# Patient Record
Sex: Female | Born: 1995 | Race: White | Hispanic: No | Marital: Single | Smoking: Never smoker
Health system: Southern US, Community
[De-identification: ages and names within clinical notes are randomized; demographics above are authoritative.]

## PROBLEM LIST (undated history)

## (undated) DIAGNOSIS — F419 Anxiety disorder, unspecified: Secondary | ICD-10-CM

## (undated) DIAGNOSIS — A6920 Lyme disease, unspecified: Secondary | ICD-10-CM

## (undated) DIAGNOSIS — O1490 Unspecified pre-eclampsia, unspecified trimester: Secondary | ICD-10-CM

## (undated) DIAGNOSIS — S060XAA Concussion with loss of consciousness status unknown, initial encounter: Secondary | ICD-10-CM

## (undated) DIAGNOSIS — S060X9A Concussion with loss of consciousness of unspecified duration, initial encounter: Secondary | ICD-10-CM

## (undated) HISTORY — PX: NO PAST SURGERIES: SHX2092

## (undated) HISTORY — DX: Anxiety disorder, unspecified: F41.9

## (undated) HISTORY — DX: Unspecified pre-eclampsia, unspecified trimester: O14.90

---

## 2018-06-27 ENCOUNTER — Ambulatory Visit (HOSPITAL_COMMUNITY)
Admission: EM | Admit: 2018-06-27 | Discharge: 2018-06-27 | Disposition: A | Discharge status: Home / Self Care | Payer: Worker's Compensation | Attending: Family Medicine | Admitting: Family Medicine

## 2018-06-27 ENCOUNTER — Encounter (HOSPITAL_COMMUNITY): Payer: Self-pay | Admitting: Emergency Medicine

## 2018-06-27 ENCOUNTER — Ambulatory Visit (INDEPENDENT_AMBULATORY_CARE_PROVIDER_SITE_OTHER): Payer: Worker's Compensation

## 2018-06-27 DIAGNOSIS — S60222A Contusion of left hand, initial encounter: Secondary | ICD-10-CM

## 2018-06-27 MED ORDER — KETOROLAC TROMETHAMINE 30 MG/ML IJ SOLN
30.0000 mg | Freq: Once | INTRAMUSCULAR | Status: AC
  Administered 2018-06-27: 30 mg via INTRAMUSCULAR

## 2018-06-27 MED ORDER — KETOROLAC TROMETHAMINE 30 MG/ML IJ SOLN
INTRAMUSCULAR | Status: AC
  Filled 2018-06-27: qty 1
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
## 2018-06-27 NOTE — Discharge Instructions (Signed)
Use ice and elevation to reduce pain and swelling Ibuprofen, naproxen or acetaminophen for pain Limit use of hand Follow up with workers compensation clinic if you fail to improve
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
## 2018-12-25 ENCOUNTER — Other Ambulatory Visit: Payer: Self-pay

## 2018-12-25 ENCOUNTER — Emergency Department (HOSPITAL_COMMUNITY): Payer: BC Managed Care – PPO

## 2018-12-25 ENCOUNTER — Emergency Department (HOSPITAL_COMMUNITY)
Admission: EM | Admit: 2018-12-25 | Discharge: 2018-12-25 | Disposition: A | Discharge status: Home / Self Care | Payer: BC Managed Care – PPO | Attending: Emergency Medicine | Admitting: Emergency Medicine

## 2018-12-25 ENCOUNTER — Encounter (HOSPITAL_COMMUNITY): Payer: Self-pay | Admitting: Emergency Medicine

## 2018-12-25 DIAGNOSIS — A6920 Lyme disease, unspecified: Secondary | ICD-10-CM | POA: Diagnosis not present

## 2018-12-25 DIAGNOSIS — R531 Weakness: Secondary | ICD-10-CM | POA: Diagnosis present

## 2018-12-25 DIAGNOSIS — G43109 Migraine with aura, not intractable, without status migrainosus: Secondary | ICD-10-CM | POA: Insufficient documentation

## 2018-12-25 HISTORY — DX: Concussion with loss of consciousness status unknown, initial encounter: S06.0XAA

## 2018-12-25 HISTORY — DX: Lyme disease, unspecified: A69.20

## 2018-12-25 HISTORY — DX: Concussion with loss of consciousness of unspecified duration, initial encounter: S06.0X9A

## 2018-12-25 LAB — CBC WITH DIFFERENTIAL/PLATELET
Abs Immature Granulocytes: 0.02 10*3/uL (ref 0.00–0.07)
Basophils Absolute: 0.1 10*3/uL (ref 0.0–0.1)
Basophils Relative: 1 %
Eosinophils Absolute: 0.1 10*3/uL (ref 0.0–0.5)
Eosinophils Relative: 2 %
HCT: 41.1 % (ref 36.0–46.0)
Hemoglobin: 13.7 g/dL (ref 12.0–15.0)
Immature Granulocytes: 0 %
Lymphocytes Relative: 29 %
Lymphs Abs: 2.2 10*3/uL (ref 0.7–4.0)
MCH: 28.7 pg (ref 26.0–34.0)
MCHC: 33.3 g/dL (ref 30.0–36.0)
MCV: 86.2 fL (ref 80.0–100.0)
Monocytes Absolute: 0.7 10*3/uL (ref 0.1–1.0)
Monocytes Relative: 10 %
Neutro Abs: 4.5 10*3/uL (ref 1.7–7.7)
Neutrophils Relative %: 58 %
Platelets: 242 10*3/uL (ref 150–400)
RBC: 4.77 MIL/uL (ref 3.87–5.11)
RDW: 12.5 % (ref 11.5–15.5)
WBC: 7.6 10*3/uL (ref 4.0–10.5)
nRBC: 0 % (ref 0.0–0.2)

## 2018-12-25 LAB — COMPREHENSIVE METABOLIC PANEL
ALT: 16 U/L (ref 0–44)
AST: 17 U/L (ref 15–41)
Albumin: 4.5 g/dL (ref 3.5–5.0)
Alkaline Phosphatase: 61 U/L (ref 38–126)
Anion gap: 11 (ref 5–15)
BUN: 14 mg/dL (ref 6–20)
CO2: 22 mmol/L (ref 22–32)
Calcium: 9.4 mg/dL (ref 8.9–10.3)
Chloride: 105 mmol/L (ref 98–111)
Creatinine, Ser: 0.75 mg/dL (ref 0.44–1.00)
GFR calc Af Amer: >60 mL/min (ref >60)
GFR calc non Af Amer: >60 mL/min (ref >60)
Glucose, Bld: 95 mg/dL (ref 70–99)
Potassium: 4.2 mmol/L (ref 3.5–5.1)
Sodium: 138 mmol/L (ref 135–145)
Total Bilirubin: 0.9 mg/dL (ref 0.3–1.2)
Total Protein: 7.3 g/dL (ref 6.5–8.1)

## 2018-12-25 LAB — ETHANOL: Alcohol, Ethyl (B): <10 mg/dL (ref <10)

## 2018-12-25 LAB — I-STAT BETA HCG BLOOD, ED (MC, WL, AP ONLY): I-stat hCG, quantitative: <5 m[IU]/mL (ref <5)

## 2018-12-25 LAB — PROTIME-INR
INR: 1.1 (ref 0.8–1.2)
Prothrombin Time: 13.6 seconds (ref 11.4–15.2)

## 2018-12-25 LAB — APTT: aPTT: 30 seconds (ref 24–36)

## 2018-12-25 LAB — I-STAT CREATININE, ED: Creatinine, Ser: 0.7 mg/dL (ref 0.44–1.00)

## 2018-12-25 MED ORDER — IOHEXOL 350 MG/ML SOLN
75.0000 mL | Freq: Once | INTRAVENOUS | Status: AC | PRN
  Administered 2018-12-25: 75 mL via INTRAVENOUS

## 2018-12-25 MED ORDER — METOCLOPRAMIDE HCL 5 MG/ML IJ SOLN
10.0000 mg | Freq: Once | INTRAMUSCULAR | Status: AC
  Administered 2018-12-25: 10 mg via INTRAVENOUS
  Filled 2018-12-25: qty 2

## 2018-12-25 MED ORDER — KETOROLAC TROMETHAMINE 30 MG/ML IJ SOLN
30.0000 mg | Freq: Once | INTRAMUSCULAR | Status: AC
  Administered 2018-12-25: 30 mg via INTRAVENOUS
  Filled 2018-12-25: qty 1

## 2018-12-25 MED ORDER — DIPHENHYDRAMINE HCL 50 MG/ML IJ SOLN
25.0000 mg | Freq: Once | INTRAMUSCULAR | Status: AC
  Administered 2018-12-25: 25 mg via INTRAVENOUS
  Filled 2018-12-25: qty 1

## 2018-12-25 NOTE — ED Notes (Signed)
Patient verbalizes understanding of discharge instructions. Opportunity for questioning and answers were provided. 

## 2018-12-25 NOTE — ED Notes (Signed)
MD and PA made aware of pt's neuro symptoms

## 2018-12-25 NOTE — ED Notes (Signed)
ED Provider at bedside. 

## 2018-12-25 NOTE — Discharge Instructions (Addendum)
Your symptoms may be due to complicated migraine.  Call and follow up with neurology for further management of your condition.  Return if you have any concerns.  Fortunately no evidence of stroke causing your condition.
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
## 2019-01-13 ENCOUNTER — Other Ambulatory Visit: Payer: Self-pay

## 2019-01-13 ENCOUNTER — Ambulatory Visit (INDEPENDENT_AMBULATORY_CARE_PROVIDER_SITE_OTHER): Payer: BC Managed Care – PPO

## 2019-01-13 ENCOUNTER — Ambulatory Visit (HOSPITAL_COMMUNITY)
Admission: EM | Admit: 2019-01-13 | Discharge: 2019-01-13 | Disposition: A | Discharge status: Home / Self Care | Payer: BC Managed Care – PPO | Attending: Family Medicine | Admitting: Family Medicine

## 2019-01-13 ENCOUNTER — Encounter (HOSPITAL_COMMUNITY): Payer: Self-pay | Admitting: *Deleted

## 2019-01-13 DIAGNOSIS — S91352A Open bite, left foot, initial encounter: Secondary | ICD-10-CM

## 2019-01-13 DIAGNOSIS — W540XXA Bitten by dog, initial encounter: Secondary | ICD-10-CM | POA: Diagnosis not present

## 2019-01-13 MED ORDER — CLINDAMYCIN HCL 300 MG PO CAPS: 300.0000 mg | ORAL_CAPSULE | Freq: Three times a day (TID) | ORAL | 0 refills | Status: AC

## 2019-01-13 NOTE — Discharge Instructions (Signed)
X-rays did not show fracture or dislocation; no obvious foreign bodies Up-to-date on tetanus Wash site with warm water and mild soap once to twice daily Change dressing daily Clindamycin 300 mg three times daily.  Take as directed and to completion Return or go to the ED if you have any new or worsening symptoms such as increasing redness, swelling, drainage, fever, chills, nausea, chest pain, SOB, etc..Marland Kitchen

## 2019-01-13 NOTE — ED Provider Notes (Signed)
[facility]   [id number] 01/13/19 Arrival Time: 1101  CC: Dog bite  SUBJECTIVE:  Renee Hobbs is a 23 y.o. female hx significant for lyme disease, who presents with a dog bite that occurred 1 day ago.  Localizes the bite to left foot.  Describes it as painful and red.  Has tried cleaning with H2O2 and applying bactine and neosporin.  States dog is up to date on rabies vaccine.  Denies fever, chills, nausea, vomiting, swelling, discharge, SOB, chest pain, abdominal pain, changes in bowel or bladder function.    ROS: As per HPI.  Past Medical History:  Diagnosis Date  . Concussion   . Lyme disease    History reviewed. No pertinent surgical history. Allergies  Allergen Reactions  . Amoxicillin Shortness Of Breath  . Doxycycline Shortness Of Breath   No current facility-administered medications on file prior to encounter.    No current outpatient medications on file prior to encounter.   Social History   Socioeconomic History  . Marital status: Single    Spouse name: Not on file  . Number of children: Not on file  . Years of education: Not on file  . Highest education level: Not on file  Occupational History  . Not on file  Social Needs  . Financial resource strain: Not on file  . Food insecurity:    Worry: Not on file    Inability: Not on file  . Transportation needs:    Medical: Not on file    Non-medical: Not on file  Tobacco Use  . Smoking status: Never Smoker  . Smokeless tobacco: Never Used  Substance and Sexual Activity  . Alcohol use: Not Currently  . Drug use: Yes    Types: Marijuana  . Sexual activity: Not on file  Lifestyle  . Physical activity:    Days per week: Not on file    Minutes per session: Not on file  . Stress: Not on file  Relationships  . Social connections:    Talks on phone: Not on file    Gets together: Not on file    Attends religious service: Not on file    Active member of club or organization: Not on file    Attends  meetings of clubs or organizations: Not on file    Relationship status: Not on file  . Intimate partner violence:    Fear of current or ex partner: Not on file    Emotionally abused: Not on file    Physically abused: Not on file    Forced sexual activity: Not on file  Other Topics Concern  . Not on file  Social History Narrative  . Not on file   Family History  Problem Relation Age of Onset  . Heart failure Mother     OBJECTIVE: Vitals:   01/13/19 1111  BP: 114/69  Pulse: 65  Resp: 16  Temp: (!) 97.2 F (36.2 C)  TempSrc: Oral  SpO2: 96%    General appearance: alert; no distress Head: NCAT Lungs: clear to auscultation bilaterally CV: Dorsalis pedis pulse 2+; cap refill <2 sec Extremities: no edema; strength and sensation intact about the left lower extremity Skin: warm and dry; two puncture wounds < 0.5 cm localized to the distal dorsum of the left foot over the 4th MT with overlying erythema, two small scratches/ abrasions localized to medial distal first MT with overlying erythema; no obvious drainage or bleeding (see pictures below) Psychological: alert and cooperative; normal mood and affect  DIAGNOSTIC STUDIES:  Dg Foot Complete Left  Result Date: 01/13/2019 CLINICAL DATA:  Left foot pain after suffering a dog bite yesterday. EXAM: LEFT FOOT - COMPLETE 3+ VIEW COMPARISON:  None. FINDINGS: Soft tissue swelling is suspected laceration about the dorsal aspect of the forefoot. No associated fracture or radiopaque foreign body. Joint spaces are preserved. No significant hallux valgus deformity. No erosions. No plantar calcaneal spur. IMPRESSION: Soft tissue swelling suspected laceration about the forefoot without associated fracture or radiopaque foreign body. Electronically Signed   By: Simonne ComeJohn  Watts M.D.   On: 01/13/2019 11:53    ASSESSMENT & PLAN:  1. Dog bite of left foot, initial encounter     Meds ordered this encounter  Medications  . clindamycin  (CLEOCIN) 300 MG capsule    Sig: Take 1 capsule (300 mg total) by mouth 3 (three) times daily for 10 days.    Dispense:  30 capsule    Refill:  0    Order Specific Question:   Supervising Provider    Answer:   Eustace MooreELSON, YVONNE SUE [[id number]]   X-rays did not show fracture or dislocation; no obvious foreign bodies Up-to-date on tetanus Wash site with warm water and mild soap once to twice daily Change dressing daily Clindamycin 300 mg three times daily.  Take as directed and to completion Return or go to the ED if you have any new or worsening symptoms such as increasing redness, swelling, drainage, fever, chills, nausea, chest pain, SOB, etc...   Reviewed expectations re: course of current medical issues. Questions answered. Outlined signs and symptoms indicating need for more acute intervention. Patient verbalized understanding. After Visit Summary given.   Rennis HardingWurst, Sharina Petre, PA-C 01/13/19 1208

## 2019-01-13 NOTE — ED Triage Notes (Signed)
C/O dog bite to left foot sustained  Yesterday afternoon by her own dog.  Dog up to date on rabies vaccination.  4 puncture wounds noted to left foot; 2 medial wounds erythematic.  C/O significant pain.  Denies any parasthesias.  Pt has cleansed wounds with H2O2 and Bactine; has been applying Neosporin.

## 2019-11-05 ENCOUNTER — Ambulatory Visit: Payer: Self-pay

## 2019-11-05 ENCOUNTER — Ambulatory Visit: Payer: BC Managed Care – PPO | Attending: Family

## 2019-11-05 DIAGNOSIS — Z23 Encounter for immunization: Secondary | ICD-10-CM | POA: Insufficient documentation

## 2019-11-05 NOTE — Progress Notes (Signed)
   Covid-19 Vaccination Clinic  Name:  Renee Hobbs    MRN: [id number] DOB: [birth year]  11/05/2019  Renee Hobbs was observed post Covid-19 immunization for 15 minutes without incidence. She was provided with Vaccine Information Sheet and instruction to access the V-Safe system.   Renee Hobbs was instructed to call 911 with any severe reactions post vaccine: Marland Kitchen Difficulty breathing  . Swelling of your face and throat  . A fast heartbeat  . A bad rash all over your body  . Dizziness and weakness    Immunizations Administered    Name Date Dose VIS Date Route   Moderna COVID-19 Vaccine 11/05/2019  3:17 PM 0.5 mL 08/11/2019 Intramuscular   Manufacturer: Moderna   Lot: [id number]   NDC: 31594-585-92

## 2019-11-10 IMAGING — MR MRI HEAD WITHOUT CONTRAST
10 of 11 series · 43 of 48 positions shown · non-contrast
Comparison: CT studies earlier same day.
COMPARISON: CT studies earlier same day.

Addendum:
CLINICAL DATA: Possible complicated migraine. Acute presentation
with right-sided weakness.

EXAM:
MRI HEAD WITHOUT CONTRAST
TECHNIQUE: Multiplanar, multiecho pulse sequences of the brain and surrounding
structures were obtained without intravenous contrast.

[Series 5: DWI · axial · 3.0mm · 0.88mm/px · z∈[-119,+19]mm · 10 of 96 slices shown (1 of 4)]
[im 1/96]
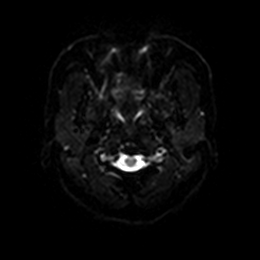
[im 11/96]
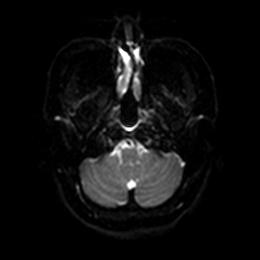
[im 22/96]
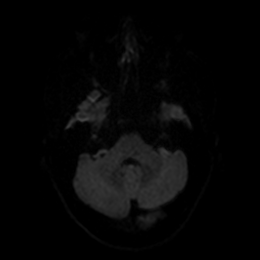
[im 32/96]
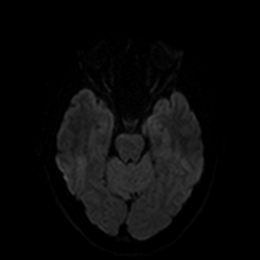
[im 43/96]
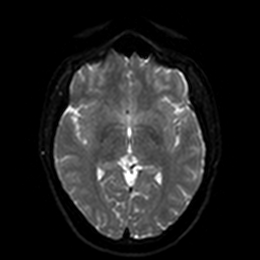
[im 53/96]
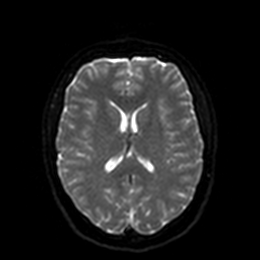
[im 64/96]
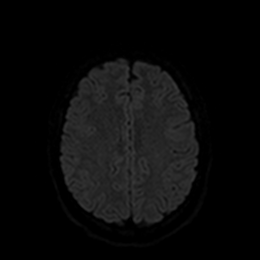
[im 74/96]
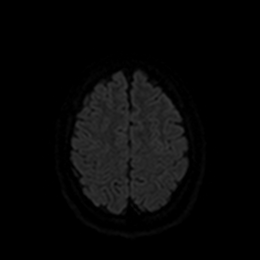
[im 85/96]
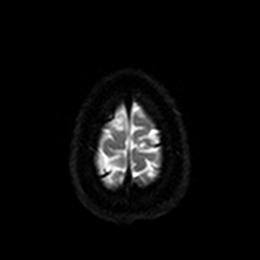
[im 96/96]
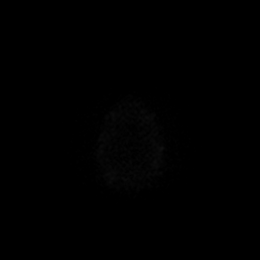

[Series 6: DWI · axial · 3.0mm · 0.88mm/px · z∈[-119,+19]mm · 5 of 48 slices shown (2 of 4)]
[im 1/48]
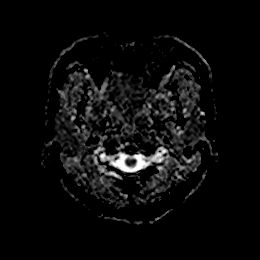
[im 12/48]
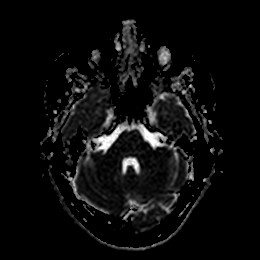
[im 24/48]
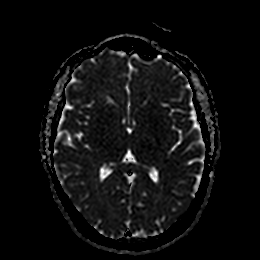
[im 36/48]
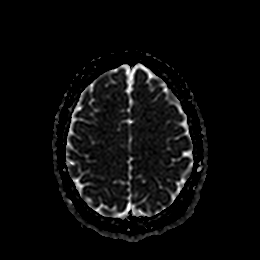
[im 48/48]
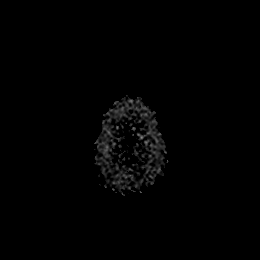

[Series 7: DWI · coronal · 4.0mm · 0.88mm/px · 6 of 64 slices shown (3 of 4)]
[im 1/64]
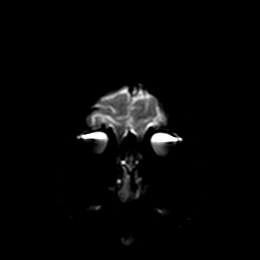
[im 13/64]
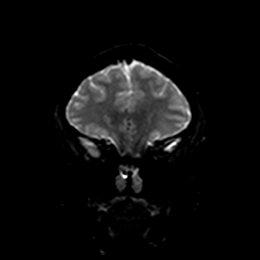
[im 26/64]
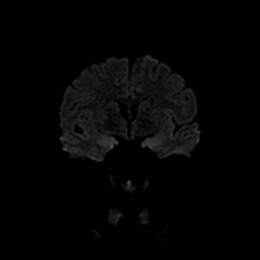
[im 38/64]
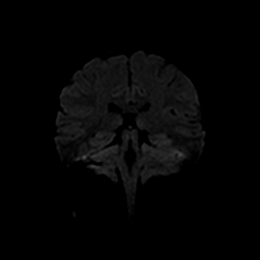
[im 51/64]
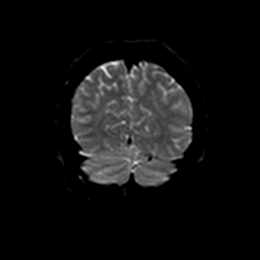
[im 64/64]
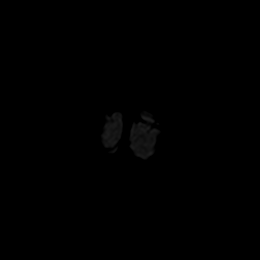

[Series 8: DWI · coronal · 4.0mm · 0.88mm/px · 3 of 32 slices shown (4 of 4)]
[im 1/32]
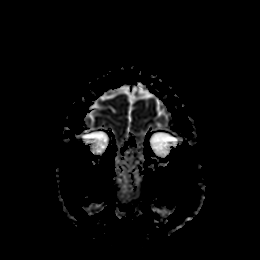
[im 16/32]
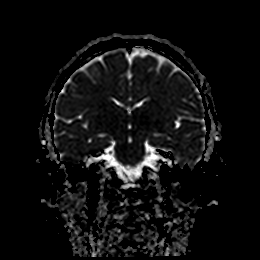
[im 32/32]
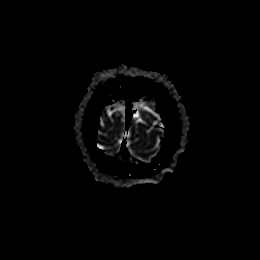

[Series 9: T1 · sagittal · 5.0mm · 0.75mm/px · 2 of 23 slices shown]
[im 1/23]
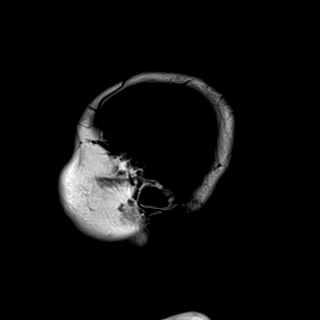
[im 23/23]
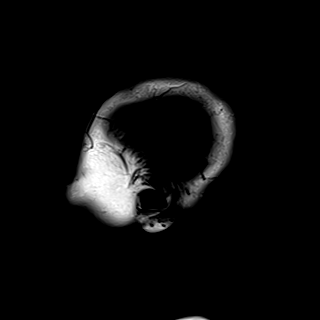

[Series 10: FLAIR · axial · 5.0mm · 0.45mm/px · z∈[-119,+23]mm · 2 of 25 slices shown]
[im 1/25]
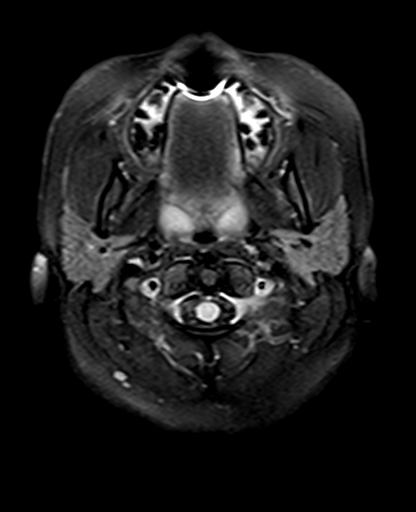
[im 25/25]
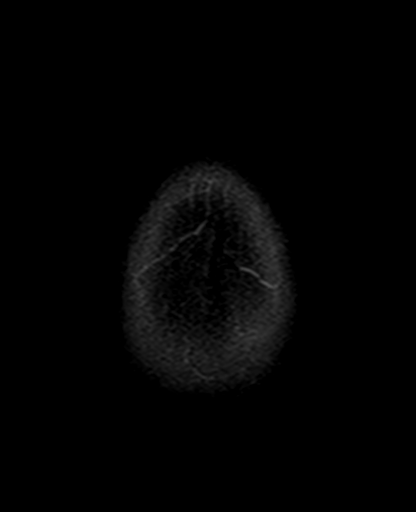

[Series 11: T2 · axial · 5.0mm · 0.72mm/px · z∈[-120,+22]mm · 2 of 25 slices shown (1 of 2)]
[im 1/25]
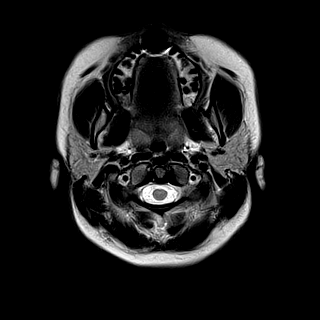
[im 25/25]
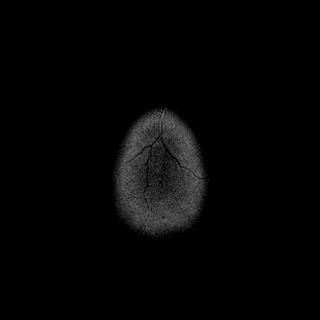

[Series 13: pha_images · axial · 3.0mm · 0.90mm/px · z∈[-130,+20]mm · 5 of 52 slices shown]
[im 1/52]
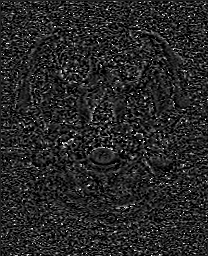
[im 13/52]
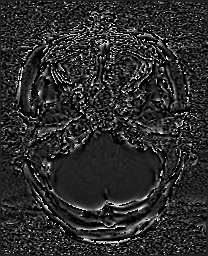
[im 26/52]
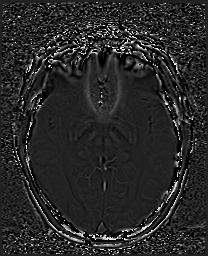
[im 39/52]
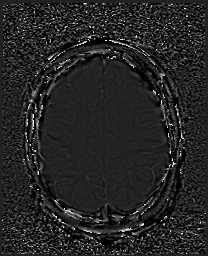
[im 52/52]
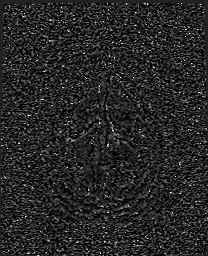

[Series 14: swi_images · axial · 3.0mm · 0.90mm/px · z∈[-130,+20]mm · 5 of 52 slices shown]
[im 1/52]
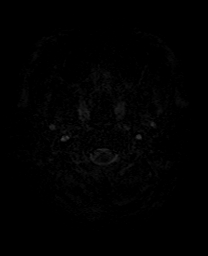
[im 13/52]
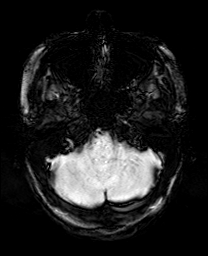
[im 26/52]
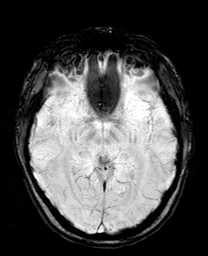
[im 39/52]
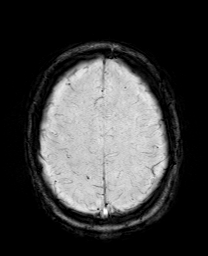
[im 52/52]
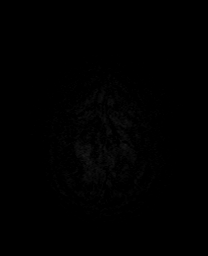

[Series 17: T2 · coronal · 5.0mm · 0.34mm/px · 3 of 29 slices shown (2 of 2)]
[im 1/29]
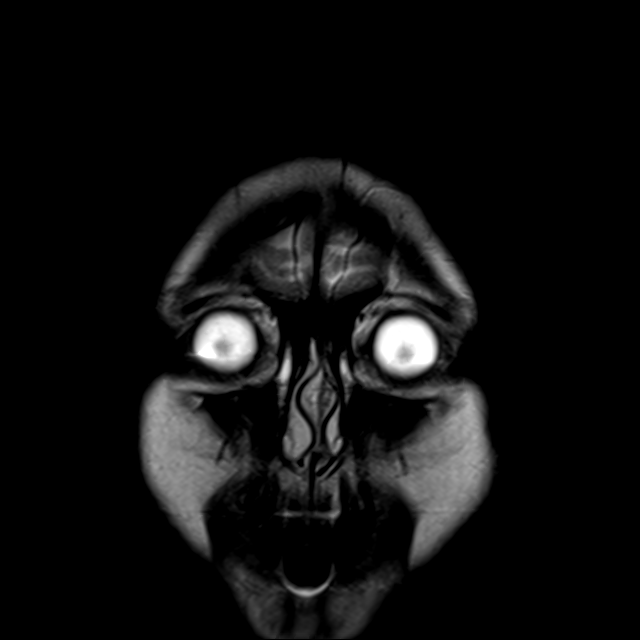
[im 15/29]
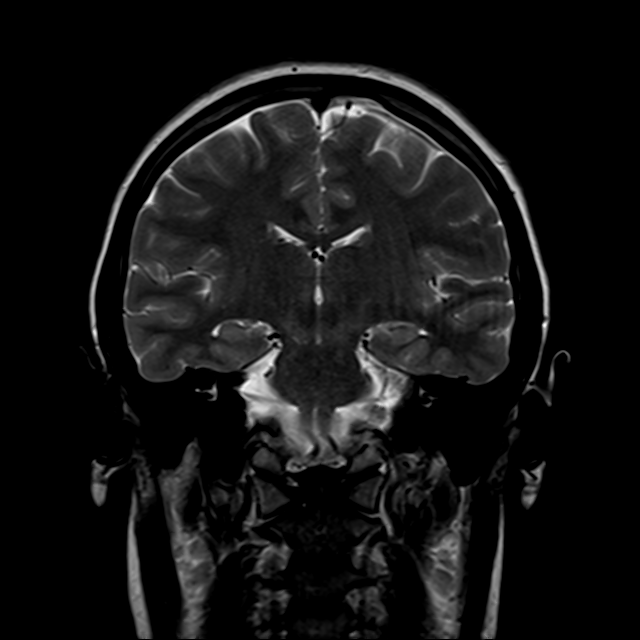
[im 29/29]
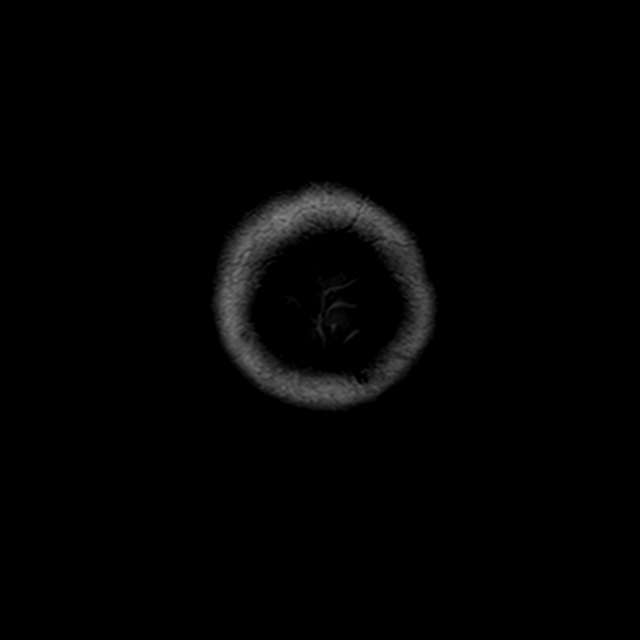

[43 of 48 positions shown; findings below may reference images not displayed]

FINDINGS: Brain: Diffusion only study is normal without evidence of acute or
subacute infarction or other cause of restricted diffusion. No
hydrocephalus. No extra-axial collection. No sign of hemorrhage.

Vascular: Major vessels at the base of the brain show flow.

Skull and upper cervical spine: Negative

Sinuses/Orbits: No abnormality seen.  Limited.

Other: None
IMPRESSION: Negative diffusion study.

ADDENDUM:
Remainder of brain sequences have now been performed. Brain has
normal appearance without evidence of old or acute infarction, mass
lesion, hemorrhage, hydrocephalus or extra-axial collection. Vessels
are patent. Sinuses are clear. Orbits are negative. No skull or
skull base lesion is seen.
IMPRESSION: IMPRESSION
Rest of the exam is now completed.  The imaging is normal.

*** End of Addendum ***
FINDINGS: Brain: Diffusion only study is normal without evidence of acute or
subacute infarction or other cause of restricted diffusion. No
hydrocephalus. No extra-axial collection. No sign of hemorrhage.

Vascular: Major vessels at the base of the brain show flow.

Skull and upper cervical spine: Negative

Sinuses/Orbits: No abnormality seen.  Limited.

Other: None
IMPRESSION: Negative diffusion study.

## 2019-12-08 ENCOUNTER — Ambulatory Visit: Payer: Self-pay

## 2019-12-08 ENCOUNTER — Ambulatory Visit: Payer: BC Managed Care – PPO | Attending: Family

## 2019-12-08 DIAGNOSIS — Z23 Encounter for immunization: Secondary | ICD-10-CM

## 2019-12-08 NOTE — Progress Notes (Signed)
   Covid-19 Vaccination Clinic  Name:  Renee Hobbs    MRN: [id number] DOB: Oct 07, 1995  12/08/2019  Renee Hobbs was observed post Covid-19 immunization for 15 minutes without incident. She was provided with Vaccine Information Sheet and instruction to access the V-Safe system.   Renee Hobbs was instructed to call 911 with any severe reactions post vaccine: Marland Kitchen Difficulty breathing  . Swelling of face and throat  . A fast heartbeat  . A bad rash all over body  . Dizziness and weakness   Immunizations Administered    Name Date Dose VIS Date Route   Moderna COVID-19 Vaccine 12/08/2019 10:14 AM 0.5 mL 08/11/2019 Intramuscular   Manufacturer: Moderna   Lot: [id number]   NDC: 66440-347-42

## 2021-06-26 ENCOUNTER — Other Ambulatory Visit: Payer: Self-pay

## 2021-06-26 ENCOUNTER — Ambulatory Visit (HOSPITAL_COMMUNITY)
Admission: EM | Admit: 2021-06-26 | Discharge: 2021-06-26 | Disposition: A | Discharge status: Home / Self Care | Payer: 59

## 2021-06-26 ENCOUNTER — Encounter (HOSPITAL_COMMUNITY): Payer: Self-pay

## 2021-06-26 DIAGNOSIS — F411 Generalized anxiety disorder: Secondary | ICD-10-CM

## 2021-06-26 NOTE — ED Provider Notes (Signed)
Behavioral Health Urgent Care Medical Screening Exam  Patient Name: Electa Sterry MRN: [id number] Date of Evaluation: 06/26/21 Chief Complaint:   Diagnosis:  Final diagnoses:  Generalized anxiety disorder    History of Present illness: Mayia Megill is a 25 y.o. female.  Patient presents voluntarily to [facility] behavioral health for walk-in assessment.  She is accompanied by her boyfriend, Marita Kansas, who remains present during assessment at patient's request.  Maisie endorses increased anxiety times several months.  She reports worsening anxiety over the past 2 weeks.  Recently she reports difficulty sleeping.  She has been followed by outpatient therapy but has not seen her therapist recently related to a new insurance coverage.  She is willing to follow-up with outpatient psychiatry moving forward.  She denies any current medications.  Patient is assessed face-to-face by nurse practitioner.  She is seated in assessment area, no acute distress.  She is alert and oriented, pleasant and cooperative during assessment.  She reports anxious mood with congruent affect, tearful at times.  She denies suicidal and homicidal ideations.  She denies any history of suicide attempts, denies history of self-harm.  She contracts verbally for safety with this Clinical research associate.   She has normal speech and behavior.  She denies both auditory and visual hallucinations.  Patient is able to converse coherently with goal-directed thoughts and no distractibility or preoccupation.  She denies paranoia.  Objectively there is no evidence of psychosis/mania or delusional thinking.  She resides in [place] with her boyfriend, denies access to weapons.  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  She endorses rare alcohol use.  She denies substance use.  She endorses decreased sleep and average appetite.  Patient offered support and encouragement.   Psychiatric Specialty Exam  Presentation  General  Appearance:Appropriate for Environment; Casual  Eye Contact:Good  Speech:Clear and Coherent; Normal Rate  Speech Volume:Normal  Handedness:Right   Mood and Affect  Mood:Anxious; Depressed  Affect:Congruent   Thought Process  Thought Processes:Coherent; Goal Directed; Linear  Descriptions of Associations:Intact  Orientation:Full (Time, Place and Person)  Thought Content:Logical    Hallucinations:None  Ideas of Reference:None  Suicidal Thoughts:No  Homicidal Thoughts:No   Sensorium  Memory:Immediate Good; Recent Good; Remote Good  Judgment:Good  Insight:Good   Executive Functions  Concentration:Good  Attention Span:Good  Recall:Good  Fund of Knowledge:Good  Language:Good   Psychomotor Activity  Psychomotor Activity:Normal   Assets  Assets:Communication Skills; Desire for Improvement; Financial Resources/Insurance; Housing; Intimacy; Leisure Time; Physical Health; Resilience; Social Support; Talents/Skills; Transportation; Vocational/Educational   Sleep  Sleep:Poor  Number of hours:  No data recorded  Nutritional Assessment (For OBS and FBC admissions only) Has the patient had a weight loss or gain of 10 pounds or more in the last 3 months?: No Has the patient had a decrease in food intake/or appetite?: No Does the patient have dental problems?: No Does the patient have eating habits or behaviors that may be indicators of an eating disorder including binging or inducing vomiting?: No Has the patient recently lost weight without trying?: 0 Has the patient been eating poorly because of a decreased appetite?: 0 Malnutrition Screening Tool Score: 0   Physical Exam: Physical Exam Vitals and nursing note reviewed.  Constitutional:      Appearance: Normal appearance. She is well-developed.  HENT:     Head: Normocephalic and atraumatic.     Nose: Nose normal.  Cardiovascular:     Rate and Rhythm: Normal rate.  Pulmonary:     Effort:  Pulmonary effort is normal.  Musculoskeletal:        General: Normal range of motion.     Cervical back: Normal range of motion.  Skin:    General: Skin is warm and dry.  Neurological:     Mental Status: She is alert and oriented to person, place, and time.  Psychiatric:        Attention and Perception: Attention and perception normal.        Mood and Affect: Mood is anxious and depressed.        Speech: Speech normal.        Behavior: Behavior normal. Behavior is cooperative.        Thought Content: Thought content normal.        Cognition and Memory: Cognition and memory normal.        Judgment: Judgment normal.   Review of Systems  Constitutional: Negative.   HENT: Negative.    Eyes: Negative.   Respiratory: Negative.    Cardiovascular: Negative.   Gastrointestinal: Negative.   Genitourinary: Negative.   Musculoskeletal: Negative.   Skin: Negative.   Neurological: Negative.   Endo/Heme/Allergies: Negative.   Psychiatric/Behavioral:  Positive for depression. The patient is nervous/anxious.   Blood pressure 127/83, pulse 87, temperature 98.6 F (37 C), temperature source Oral, resp. rate 16, SpO2 98 %. There is no height or weight on file to calculate BMI.  Musculoskeletal: Strength & Muscle Tone: within normal limits Gait & Station: normal Patient leans: N/A   [facility] Discharge Disposition for Follow up and Recommendations: Patient reviewed with Dr Bronwen Betters. Follow up with outpatient psychiatry resources provided.    Lenard Lance, FNP 06/26/2021, 11:50 AM

## 2021-06-26 NOTE — Discharge Instructions (Signed)

## 2021-06-26 NOTE — Discharge Summary (Signed)
Renee Hobbs to be D/C'd Home per FNP order. Discussed with the patient and all questions fully answered. An After Visit Summary was printed and given to the patient. Patient escorted out and D/C home via private auto.  Dickie La  06/26/2021 11:59 AM

## 2021-06-26 NOTE — BH Assessment (Signed)
Pt reports anxiety attack yesterday with continued symptoms today ("feeling outside my body, hazy, hyperventilating, afraid to be in the dark and take showers alone"). Pt reports anxiety attacks for the past couple of weeks seems to be worsening which has impacted her life (afraid to fly on plain). Pt has a hx of anxiety, was taking medications PRN when she was in college. Pt denies SI, HI, AVH, however reports AH of hearing loud sounds and "feeling something around me". Pt appears anxious. No oupt services and denies substance use.   Pt is routine

## 2021-10-20 ENCOUNTER — Other Ambulatory Visit: Payer: Self-pay

## 2021-10-20 ENCOUNTER — Encounter: Payer: Self-pay | Admitting: Internal Medicine

## 2021-10-20 ENCOUNTER — Telehealth: Payer: Self-pay | Admitting: Internal Medicine

## 2021-10-20 ENCOUNTER — Ambulatory Visit: Payer: 59 | Attending: Internal Medicine | Admitting: Internal Medicine

## 2021-10-20 VITALS — BP 119/78 | HR 84 | Resp 16 | Ht 64.0 in | Wt 217.0 lb

## 2021-10-20 DIAGNOSIS — E669 Obesity, unspecified: Secondary | ICD-10-CM

## 2021-10-20 DIAGNOSIS — L659 Nonscarring hair loss, unspecified: Secondary | ICD-10-CM | POA: Diagnosis not present

## 2021-10-20 DIAGNOSIS — Z7689 Persons encountering health services in other specified circumstances: Secondary | ICD-10-CM

## 2021-10-20 DIAGNOSIS — R635 Abnormal weight gain: Secondary | ICD-10-CM

## 2021-10-20 NOTE — Telephone Encounter (Signed)
Pattient states, she isnt on any birth control at all

## 2021-10-20 NOTE — Telephone Encounter (Signed)
Patient sttes she isnt on any of these birth controls.

## 2021-10-20 NOTE — Progress Notes (Signed)
Patient ID: Renee Hobbs, female    DOB: May 26, 1996  MRN: [id number]  CC: New Patient (Initial Visit), Panic Attack (Only lasted the month of October ), and Weight Gain (Gained over 60lbs)   Subjective: Renee Hobbs is a 26 y.o. female who presents for new pt visit Her concerns today include:    Last PCP was through her college clinic in [place].  No PCP in [place].  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    Hx of chronic lyme ds as a teenager.  Symptoms have resolved.     History of panic disorder/anxiety: She has a therapist and a psychiatrist.  She sees the therapist once a week and the psychiatrist once a month.  In October of last year, she started having weird panic symptoms for several weeks.  The symptoms resolved after a few weeks.  She was prescribed some hydroxyzine to use as needed which she has not had to use much at all.  Main concern today is unexplained weight gain.  Weight is usually  stable around 140 pounds.  September of last year she weighed about 144 pounds.  Patient states she has always been very athletic and eats a fairly healthy diet.  She works out 4-6 times a week walking and running. -She has noticed thinning of the hair with increased oily scalp.  She is also experiencing breakout of the skin on the face and stretch marks on her abdomen and on the upper inner arms. -She sometimes feels hot at nights.  She is also noted increased thirst at nights.  Bowel movements alternate between constipation and diarrhea. She is frustrated with the unexplained weight gain  No current outpatient medications on file prior to visit.   No current facility-administered medications on file prior to visit.    Allergies  Allergen Reactions   Amoxicillin Shortness Of Breath   Doxycycline Shortness Of Breath    Social History   Socioeconomic History   Marital status: Single    Spouse name: Not on file   Number of  children: 0   Years of education: Not on file   Highest education level: Bachelor's degree (e.g., BA, AB, BS)  Occupational History   Not on file  Tobacco Use   Smoking status: Never   Smokeless tobacco: Never  Vaping Use   Vaping Use: Never used  Substance and Sexual Activity   Alcohol use: Not Currently   Drug use: Yes    Types: Marijuana   Sexual activity: Not on file  Other Topics Concern   Not on file  Social History Narrative   Not on file   Social Determinants of Health   Financial Resource Strain: Not on file  Food Insecurity: Not on file  Transportation Needs: Not on file  Physical Activity: Not on file  Stress: Not on file  Social Connections: Not on file  Intimate Partner Violence: Not on file    Family History  Problem Relation Age of Onset   Heart disease Mother    Heart failure Mother    Diabetes Maternal Uncle     Past Surgical History:  Procedure Laterality Date   NO PAST SURGERIES      ROS: Review of Systems Negative except as stated above  PHYSICAL EXAM: BP 119/78    Pulse 84    Resp 16    Ht 5\' 4"  (1.626 m)    Wt 217 lb (98.4  kg)    LMP 09/23/2021    SpO2 96%    BMI 37.25 kg/m   Wt Readings from Last 3 Encounters:  10/20/21 217 lb (98.4 kg)  12/25/18 154 lb 5.2 oz (70 kg)    Physical Exam  General appearance - alert, well appearing, obese young Caucasian female and in no distress Mental status - normal mood, behavior, speech, dress, motor activity, and thought processes Mouth - mucous membranes moist, pharynx normal without lesions Neck - supple, no significant adenopathy.  No thyromegaly or thyroid nodules. Chest - clear to auscultation, no wheezes, rales or rhonchi, symmetric air entry Heart - normal rate, regular rhythm, normal S1, S2, no murmurs, rubs, clicks or gallops Abdomen - soft, nontender, nondistended, no masses or organomegaly Extremities -no lower extremity edema. Skin -fine acne over the cheeks and forehead.  Stretch  marks noted over the lower abdomen.  Hair seems thin especially in the temple areas.   CMP Latest Ref Rng & Units 12/25/2018 12/25/2018  Glucose 70 - 99 mg/dL - 95  BUN 6 - 20 mg/dL - 14  Creatinine 8.29 - 1.00 mg/dL 5.62 1.30  Sodium 865 - 145 mmol/L - 138  Potassium 3.5 - 5.1 mmol/L - 4.2  Chloride 98 - 111 mmol/L - 105  CO2 22 - 32 mmol/L - 22  Calcium 8.9 - 10.3 mg/dL - 9.4  Total Protein 6.5 - 8.1 g/dL - 7.3  Total Bilirubin 0.3 - 1.2 mg/dL - 0.9  Alkaline Phos 38 - 126 U/L - 61  AST 15 - 41 U/L - 17  ALT 0 - 44 U/L - 16   Lipid Panel  No results found for: CHOL, TRIG, HDL, CHOLHDL, VLDL, LDLCALC, LDLDIRECT  CBC    Component Value Date/Time   WBC 7.6 12/25/2018 1109   RBC 4.77 12/25/2018 1109   HGB 13.7 12/25/2018 1109   HCT 41.1 12/25/2018 1109   PLT 242 12/25/2018 1109   MCV 86.2 12/25/2018 1109   MCH 28.7 12/25/2018 1109   MCHC 33.3 12/25/2018 1109   RDW 12.5 12/25/2018 1109   LYMPHSABS 2.2 12/25/2018 1109   MONOABS 0.7 12/25/2018 1109   EOSABS 0.1 12/25/2018 1109   BASOSABS 0.1 12/25/2018 1109    ASSESSMENT AND PLAN: 1. Encounter to establish care  2. Unexplained weight gain Differential diagnoses include hypothyroidism, Cushing's or dietary indiscretions.  Currently not on any medications to cause wgh gain.  We will start by checking some baseline labs including screening for thyroid disease and diabetes.  If labs come back okay, next step would be to screen for Cushing's disease. - TSH+T4F+T3Free  3. Obesity (BMI 35.0-39.9 without comorbidity) See #2 above. Discussed and encourage healthy eating habits which it sounds like she is already doing.  Continue regular exercise - CBC - Comprehensive metabolic panel - Lipid panel - Hemoglobin A1c  4. Hair loss - TSH+T4F+T3Free   Patient was given the opportunity to ask questions.  Patient verbalized understanding of the plan and was able to repeat key elements of the plan.   Orders Placed This Encounter   Procedures   CBC   Comprehensive metabolic panel   Lipid panel   TSH+T4F+T3Free   Hemoglobin A1c     Requested Prescriptions    No prescriptions requested or ordered in this encounter    Return in about 3 weeks (around 11/10/2021).  Jonah Blue, MD, FACP

## 2021-10-21 ENCOUNTER — Telehealth: Payer: Self-pay | Admitting: Internal Medicine

## 2021-10-21 LAB — CBC
Hematocrit: 39.5 % (ref 34.0–46.6)
Hemoglobin: 13.4 g/dL (ref 11.1–15.9)
MCH: 27.7 pg (ref 26.6–33.0)
MCHC: 33.9 g/dL (ref 31.5–35.7)
MCV: 82 fL (ref 79–97)
Platelets: 308 10*3/uL (ref 150–450)
RBC: 4.84 x10E6/uL (ref 3.77–5.28)
RDW: 12.1 % (ref 11.7–15.4)
WBC: 9 10*3/uL (ref 3.4–10.8)

## 2021-10-21 LAB — COMPREHENSIVE METABOLIC PANEL
ALT: 22 IU/L (ref 0–32)
AST: 19 IU/L (ref 0–40)
Albumin/Globulin Ratio: 1.7 (ref 1.2–2.2)
Albumin: 4.5 g/dL (ref 3.9–5.0)
Alkaline Phosphatase: 81 IU/L (ref 44–121)
BUN/Creatinine Ratio: 15 (ref 9–23)
BUN: 13 mg/dL (ref 6–20)
Bilirubin Total: <0.2 mg/dL (ref 0.0–1.2)
CO2: 25 mmol/L (ref 20–29)
Calcium: 9.6 mg/dL (ref 8.7–10.2)
Chloride: 101 mmol/L (ref 96–106)
Creatinine, Ser: 0.85 mg/dL (ref 0.57–1.00)
Globulin, Total: 2.6 g/dL (ref 1.5–4.5)
Glucose: 83 mg/dL (ref 70–99)
Potassium: 4.3 mmol/L (ref 3.5–5.2)
Sodium: 140 mmol/L (ref 134–144)
Total Protein: 7.1 g/dL (ref 6.0–8.5)
eGFR: 97 mL/min/{1.73_m2} (ref >59)

## 2021-10-21 LAB — HEMOGLOBIN A1C
Est. average glucose Bld gHb Est-mCnc: 117 mg/dL
Hgb A1c MFr Bld: 5.7 % — ABNORMAL HIGH (ref 4.8–5.6)

## 2021-10-21 LAB — TSH+T4F+T3FREE
Free T4: 1.02 ng/dL (ref 0.82–1.77)
T3, Free: 3.5 pg/mL (ref 2.0–4.4)
TSH: 0.712 u[IU]/mL (ref 0.450–4.500)

## 2021-10-21 LAB — LIPID PANEL
Chol/HDL Ratio: 5.2 ratio — ABNORMAL HIGH (ref 0.0–4.4)
Cholesterol, Total: 188 mg/dL (ref 100–199)
HDL: 36 mg/dL — ABNORMAL LOW (ref >39)
LDL Chol Calc (NIH): 110 mg/dL — ABNORMAL HIGH (ref 0–99)
Triglycerides: 241 mg/dL — ABNORMAL HIGH (ref 0–149)
VLDL Cholesterol Cal: 42 mg/dL — ABNORMAL HIGH (ref 5–40)

## 2021-10-21 MED ORDER — DEXAMETHASONE 1 MG PO TABS: ORAL_TABLET | ORAL | 0 refills | Status: DC

## 2021-10-21 NOTE — Telephone Encounter (Signed)
Phone call placed to patient this morning to go over lab results.  I left a voicemail message letting her know who I am and that I will send her lab results to her MyChart account.  Advised that she reads my comments as we will need to do an additional test.

## 2021-11-13 ENCOUNTER — Ambulatory Visit: Payer: 59 | Attending: Internal Medicine | Admitting: Internal Medicine

## 2021-11-13 ENCOUNTER — Encounter: Payer: Self-pay | Admitting: Internal Medicine

## 2021-11-13 ENCOUNTER — Other Ambulatory Visit: Payer: Self-pay

## 2021-11-13 VITALS — BP 111/74 | HR 81 | Ht 64.5 in | Wt 218.2 lb

## 2021-11-13 DIAGNOSIS — E782 Mixed hyperlipidemia: Secondary | ICD-10-CM

## 2021-11-13 DIAGNOSIS — N911 Secondary amenorrhea: Secondary | ICD-10-CM

## 2021-11-13 DIAGNOSIS — N926 Irregular menstruation, unspecified: Secondary | ICD-10-CM

## 2021-11-13 DIAGNOSIS — R7303 Prediabetes: Secondary | ICD-10-CM

## 2021-11-13 DIAGNOSIS — R635 Abnormal weight gain: Secondary | ICD-10-CM

## 2021-11-13 DIAGNOSIS — L680 Hirsutism: Secondary | ICD-10-CM | POA: Diagnosis not present

## 2021-11-13 MED ORDER — METFORMIN HCL 500 MG PO TABS: ORAL_TABLET | ORAL | 3 refills | Status: DC

## 2021-11-13 NOTE — Patient Instructions (Signed)
Polycystic Ovary Syndrome Polycystic ovarian syndrome (PCOS) is a common hormonal disorder among women of reproductive age. In most women with PCOS, small fluid-filled sacs (cysts) grow on the ovaries. PCOS can cause problems with menstrual periods and make it hard to get and stay pregnant. If this condition is not treated, it can lead to serious health problems, such as diabetes and heart disease. What are the causes? The cause of this condition is not known. It may be due to certain factors, such as: Irregular menstrual cycle. High levels of certain hormones. Problems with the hormone that helps to control blood sugar (insulin). Certain genes. What increases the risk? You are more likely to develop this condition if you: Have a family history of PCOS or type 2 diabetes. Are overweight, eat unhealthy foods, and are not active. These factors may cause problems with blood sugar control, which can contribute to PCOS or PCOS symptoms. What are the signs or symptoms? Symptoms of this condition include: Ovarian cysts and sometimes pelvic pain. Menstrual periods that are not regular or are too heavy. Inability to get or stay pregnant. Increased growth of hair on the face, chest, stomach, back, thumbs, thighs, or toes. Acne or oily skin. Acne may develop during adulthood, and it may not get better with treatment. Weight gain or obesity. Patches of thickened and dark brown or black skin on the neck, arms, breasts, or thighs. How is this diagnosed? This condition is diagnosed based on: Your medical history. A physical exam that includes a pelvic exam. Your health care provider may look for areas of increased hair growth on your skin. Tests, such as: An ultrasound to check the ovaries for cysts and to view the lining of the uterus. Blood tests to check levels of sugar (glucose), female hormone (testosterone), and female hormones (estrogen and progesterone). How is this treated? There is no cure  for this condition, but treatment can help to manage symptoms and prevent more health problems from developing. Treatment varies depending on your symptoms and if you want to have a baby or if you need birth control. Treatment may include: Making nutrition and lifestyle changes. Taking the progesterone hormone to start a menstrual period. Taking birth control pills to help you have regular menstrual periods. Taking medicines such as: Medicines to make you ovulate, if you want to get pregnant. Medicine to reduce extra hair growth. Having surgery in severe cases. This may involve making small holes in one or both of your ovaries. This decreases the amount of testosterone that your body makes. Follow these instructions at home: Take over-the-counter and prescription medicines only as told by your health care provider. Follow a healthy meal plan that includes lean proteins, complex carbohydrates, fresh fruits and vegetables, low-fat dairy products, healthy fats, and fiber. If you are overweight, lose weight as told by your health care provider. Your health care provider can determine how much weight loss is best for you and can help you lose weight safely. Keep all follow-up visits. This is important. Contact a health care provider if: Your symptoms do not get better with medicine. Your symptoms get worse or you develop new symptoms. Summary Polycystic ovarian syndrome (PCOS) is a common hormonal disorder among women of reproductive age. PCOS can cause problems with menstrual periods and make it hard to get and stay pregnant. If this condition is not treated, it can lead to serious health problems, such as diabetes and heart disease. There is no cure for this condition, but treatment  can help to manage symptoms and prevent more health problems from developing. This information is not intended to replace advice given to you by your health care provider. Make sure you discuss any questions you have  with your health care provider. Document Revised: 02/04/2020 Document Reviewed: 02/04/2020 Elsevier Patient Education  2022 Elsevier Inc.   Prediabetes Eating Plan Prediabetes is a condition that causes blood sugar (glucose) levels to be higher than normal. This increases the risk for developing type 2 diabetes (type 2 diabetes mellitus). Working with a health care provider or nutrition specialist (dietitian) to make diet and lifestyle changes can help prevent the onset of diabetes. These changes may help you: Control your blood glucose levels. Improve your cholesterol levels. Manage your blood pressure. What are tips for following this plan? Reading food labels Read food labels to check the amount of fat, salt (sodium), and sugar in prepackaged foods. Avoid foods that have: Saturated fats. Trans fats. Added sugars. Avoid foods that have more than 300 milligrams (mg) of sodium per serving. Limit your sodium intake to less than 2,300 mg each day. Shopping Avoid buying pre-made and processed foods. Avoid buying drinks with added sugar. Cooking Cook with olive oil. Do not use butter, lard, or ghee. Bake, broil, grill, steam, or boil foods. Avoid frying. Meal planning  Work with your dietitian to create an eating plan that is right for you. This may include tracking how many calories you take in each day. Use a food diary, notebook, or mobile application to track what you eat at each meal. Consider following a Mediterranean diet. This includes: Eating several servings of fresh fruits and vegetables each day. Eating fish at least twice a week. Eating one serving each day of whole grains, beans, nuts, and seeds. Using olive oil instead of other fats. Limiting alcohol. Limiting red meat. Using nonfat or low-fat dairy products. Consider following a plant-based diet. This includes dietary choices that focus on eating mostly vegetables and fruit, grains, beans, nuts, and seeds. If you have  high blood pressure, you may need to limit your sodium intake or follow a diet such as the DASH (Dietary Approaches to Stop Hypertension) eating plan. The DASH diet aims to lower high blood pressure. Lifestyle Set weight loss goals with help from your health care team. It is recommended that most people with prediabetes lose 7% of their body weight. Exercise for at least 30 minutes 5 or more days a week. Attend a support group or seek support from a mental health counselor. Take over-the-counter and prescription medicines only as told by your health care provider. What foods are recommended? Fruits Berries. Bananas. Apples. Oranges. Grapes. Papaya. Mango. Pomegranate. Kiwi. Grapefruit. Cherries. Vegetables Lettuce. Spinach. Peas. Beets. Cauliflower. Cabbage. Broccoli. Carrots. Tomatoes. Squash. Eggplant. Herbs. Peppers. Onions. Cucumbers. Brussels sprouts. Grains Whole grains, such as whole-wheat or whole-grain breads, crackers, cereals, and pasta. Unsweetened oatmeal. Bulgur. Barley. Quinoa. Brown rice. Corn or whole-wheat flour tortillas or taco shells. Meats and other proteins Seafood. Poultry without skin. Lean cuts of pork and beef. Tofu. Eggs. Nuts. Beans. Dairy Low-fat or fat-free dairy products, such as yogurt, cottage cheese, and cheese. Beverages Water. Tea. Coffee. Sugar-free or diet soda. Seltzer water. Low-fat or nonfat milk. Milk alternatives, such as soy or almond milk. Fats and oils Olive oil. Canola oil. Sunflower oil. Grapeseed oil. Avocado. Walnuts. Sweets and desserts Sugar-free or low-fat pudding. Sugar-free or low-fat ice cream and other frozen treats. Seasonings and condiments Herbs. Sodium-free spices. Mustard. Relish. Low-salt, low-sugar  ketchup. Low-salt, low-sugar barbecue sauce. Low-fat or fat-free mayonnaise. The items listed above may not be a complete list of recommended foods and beverages. Contact a dietitian for more information. What foods are not  recommended? Fruits Fruits canned with syrup. Vegetables Canned vegetables. Frozen vegetables with butter or cream sauce. Grains Refined white flour and flour products, such as bread, pasta, snack foods, and cereals. Meats and other proteins Fatty cuts of meat. Poultry with skin. Breaded or fried meat. Processed meats. Dairy Full-fat yogurt, cheese, or milk. Beverages Sweetened drinks, such as iced tea and soda. Fats and oils Butter. Lard. Ghee. Sweets and desserts Baked goods, such as cake, cupcakes, pastries, cookies, and cheesecake. Seasonings and condiments Spice mixes with added salt. Ketchup. Barbecue sauce. Mayonnaise. The items listed above may not be a complete list of foods and beverages that are not recommended. Contact a dietitian for more information. Where to find more information American Diabetes Association: www.diabetes.org Summary You may need to make diet and lifestyle changes to help prevent the onset of diabetes. These changes can help you control blood sugar, improve cholesterol levels, and manage blood pressure. Set weight loss goals with help from your health care team. It is recommended that most people with prediabetes lose 7% of their body weight. Consider following a Mediterranean diet. This includes eating plenty of fresh fruits and vegetables, whole grains, beans, nuts, seeds, fish, and low-fat dairy, and using olive oil instead of other fats. This information is not intended to replace advice given to you by your health care provider. Make sure you discuss any questions you have with your health care provider. Document Revised: 11/26/2019 Document Reviewed: 11/26/2019 Elsevier Patient Education  2022 [employer].

## 2021-11-13 NOTE — Progress Notes (Signed)
? ? ?Patient ID: Renee Hobbs, female    DOB: 10-03-1995  MRN: [id number] ? ?CC: Follow-up (3 follow up , discuss taking streiods, , discuss weight gain , no new changes to her diet and still working out and still gain weight. ) and Asthma ? ? ?Subjective: ?Renee Hobbs is a 26 y.o. female who presents for 3 wks f/u for concern about unexplained wgh gain ?Her concerns today include:  ? ?On last visit, patient complained of unexplained weight gain despite regular exercise and healthy eating habits.  This was associated with thinning of the hair, oily scalp, breakout of the skin on the face and stretch marks on the abdomen and upper arms.  She screened positive for prediabetes and was found to have elevated triglyceride and LDL cholesterol.  Patient was sent lab results.  I told her that we can screen for Cushing's by doing dexamethasone suppression test.  Prescription was sent for dexamethasone but patient decided to hold off on it because she felt the medicine may trigger anxiety/panic attack.  She also know that it can interfere with sleep and she already struggles with getting good sleep. ? ?Since getting her lab results, patient states that she has double down on eating healthy whole foods.  She snacks on fruits and vegetables.  Not drinking any sweet drinks. ?Eating whole foods.  Snacks on fruits and veggies.  No sweet drinks.  ?Still working out 4-6 times a wk.  Walks outside and goes to gym as well.  ? ?Today she tells me that her menstrual cycles have been irregular.  She had IUD removed over a year ago.  Prior to that she was tried with birth control pills.  She was tried with several of them but was intolerant due to different side effects. ?She has kept a calendar of her cycles for the past 6 to 8 months.  On average her cycles have ranged from 26- 54 days.  Her last cycle was 09/27/2021.  She has done several home pregnancy tests which have been negative.  Last one was done 1 week ago.  She would like to be  referred to gynecology.  She has several relatives with PCOS. ?-She endorses hirsutism where she is shaving her chin and the upper lip.  She also endorses hair around the nipples.  All starting within the past several months.  Denies any enlargement of the clitoris. ?She also reports soreness and sensitivity around her stretch marks. ?She has not gained weight since last visit. ? ?There are no problems to display for this patient. ?  ? ?Current Outpatient Medications on File Prior to Visit  ?Medication Sig Dispense Refill  ? hydrOXYzine (ATARAX) 25 MG tablet Take 25 mg by mouth 2 (two) times daily. (Patient not taking: Reported on 11/13/2021)    ? ?No current facility-administered medications on file prior to visit.  ? ? ?Allergies  ?Allergen Reactions  ? Amoxicillin Shortness Of Breath  ? Doxycycline Shortness Of Breath  ? ? ?Social History  ? ?Socioeconomic History  ? Marital status: Single  ?  Spouse name: Not on file  ? Number of children: 0  ? Years of education: Not on file  ? Highest education level: Bachelor's degree (e.g., BA, AB, BS)  ?Occupational History  ? Not on file  ?Tobacco Use  ? Smoking status: Never  ? Smokeless tobacco: Never  ?Vaping Use  ? Vaping Use: Never used  ?Substance and Sexual Activity  ? Alcohol use: Not Currently  ? Drug  use: Yes  ?  Types: Marijuana  ? Sexual activity: Not on file  ?Other Topics Concern  ? Not on file  ?Social History Narrative  ? Not on file  ? ?Social Determinants of Health  ? ?Financial Resource Strain: Not on file  ?Food Insecurity: Not on file  ?Transportation Needs: Not on file  ?Physical Activity: Not on file  ?Stress: Not on file  ?Social Connections: Not on file  ?Intimate Partner Violence: Not on file  ? ? ?Family History  ?Problem Relation Age of Onset  ? Heart disease Mother   ? Heart failure Mother   ? Diabetes Maternal Uncle   ? ? ?Past Surgical History:  ?Procedure Laterality Date  ? NO PAST SURGERIES    ? ? ?ROS: ?Review of Systems ?Negative except as  stated above ? ?PHYSICAL EXAM: ?BP 123/85   Pulse 81   Ht 5' 4.5" (1.638 m)   Wt 218 lb 3.2 oz (99 kg)   LMP  (LMP Unknown)   SpO2 95%   BMI 36.88 kg/m?   ?Wt Readings from Last 3 Encounters:  ?11/13/21 218 lb 3.2 oz (99 kg)  ?10/20/21 217 lb (98.4 kg)  ?12/25/18 154 lb 5.2 oz (70 kg)  ? ? ?Physical Exam ? ?General appearance - alert, well appearing, young Caucasian female and in no distress ?Mental status - normal mood, behavior, speech, dress, motor activity, and thought processes ?Chest - clear to auscultation, no wheezes, rales or rhonchi, symmetric air entry ?Heart - normal rate, regular rhythm, normal S1, S2, no murmurs, rubs, clicks or gallops ?Skin: Mild acne on the face.  She has mild cost here noted on the chin. ? ? ?CMP Latest Ref Rng & Units 10/20/2021 12/25/2018 12/25/2018  ?Glucose 70 - 99 mg/dL 83 - 95  ?BUN 6 - 20 mg/dL 13 - 14  ?Creatinine 0.57 - 1.00 mg/dL 2.75 1.70 0.17  ?Sodium 134 - 144 mmol/L 140 - 138  ?Potassium 3.5 - 5.2 mmol/L 4.3 - 4.2  ?Chloride 96 - 106 mmol/L 101 - 105  ?CO2 20 - 29 mmol/L 25 - 22  ?Calcium 8.7 - 10.2 mg/dL 9.6 - 9.4  ?Total Protein 6.0 - 8.5 g/dL 7.1 - 7.3  ?Total Bilirubin 0.0 - 1.2 mg/dL <4.9 - 0.9  ?Alkaline Phos 44 - 121 IU/L 81 - 61  ?AST 0 - 40 IU/L 19 - 17  ?ALT 0 - 32 IU/L 22 - 16  ? ?Lipid Panel  ?   ?Component Value Date/Time  ? CHOL 188 10/20/2021 1505  ? TRIG 241 (H) 10/20/2021 1505  ? HDL 36 (L) 10/20/2021 1505  ? CHOLHDL 5.2 (H) 10/20/2021 1505  ? LDLCALC 110 (H) 10/20/2021 1505  ? ? ?CBC ?   ?Component Value Date/Time  ? WBC 9.0 10/20/2021 1505  ? WBC 7.6 12/25/2018 1109  ? RBC 4.84 10/20/2021 1505  ? RBC 4.77 12/25/2018 1109  ? HGB 13.4 10/20/2021 1505  ? HCT 39.5 10/20/2021 1505  ? PLT 308 10/20/2021 1505  ? MCV 82 10/20/2021 1505  ? MCH 27.7 10/20/2021 1505  ? MCH 28.7 12/25/2018 1109  ? MCHC 33.9 10/20/2021 1505  ? MCHC 33.3 12/25/2018 1109  ? RDW 12.1 10/20/2021 1505  ? LYMPHSABS 2.2 12/25/2018 1109  ? MONOABS 0.7 12/25/2018 1109  ? EOSABS 0.1  12/25/2018 1109  ? BASOSABS 0.1 12/25/2018 1109  ? ? ?ASSESSMENT AND PLAN: ?1. Unexplained weight gain ?Based on the constellation of her symptoms including weight gain/obesity, acne, hirsutism, hair loss  on the scalp, irregular cycles, she seems most likely to have PCOS.  Went over this diagnosis with her including symptoms, diagnostic criteria of irregular cycles, cysts on the ovaries on ultrasound (we have not done [place]) and hyperandrogenism.  She meets 2 out of these 3 criteria which is sufficient to make diagnosis. ?-She is not interested in being on birth control to regulate menstrual cycle because she is open to becoming pregnant and she did not tolerate birth control pills in the past. ?We discussed using metformin which will help with diagnosis of prediabetes and PCOS to help bring about regular ovulation.  She is agreeable to trying metformin. ?-I also recommend checking testosterone level and DHEA-S. ?We will still also screen for Cushing's given current symptoms plus stria/stretch marks.  Patient prefers to do the 24-hour urine cortisol rather than dexamethasone suppression test. ?- Cortisol, urine, free ? ?2. Irregular menses ?See discussion above. ?- Ambulatory referral to Gynecology ?- Testosterone ?- DHEA-sulfate ?- Human Chorionic Gonadotropin (hCG),Quantitative (Serial Monitor) ? ?3. Amenorrhea, secondary ?- Human Chorionic Gonadotropin (hCG),Quantitative (Serial Monitor) ? ?4. Hirsutism ?See discussion under #1 ?- Testosterone ?- DHEA-sulfate ?- Cortisol, urine, free ? ?5. Prediabetes ?Discussed and encourage healthy eating habits.  She will continue regular exercise. ? ?6. Mixed hyperlipidemia ?See #5 above. ? ? ?Patient was given the opportunity to ask questions.  Patient verbalized understanding of the plan and was able to repeat key elements of the plan.  ? ?This documentation was completed using Paediatric nurse.  Any transcriptional errors are unintentional. ? ?No orders of  the defined types were placed in this encounter. ? ? ? ?Requested Prescriptions  ? ? No prescriptions requested or ordered in this encounter  ? ? ?No follow-ups on file. ? ?Jonah Blue, MD, FACP ?

## 2021-11-14 LAB — DHEA-SULFATE: DHEA-SO4: 248 ug/dL (ref 84.8–378.0)

## 2021-11-14 LAB — TESTOSTERONE: Testosterone: 22 ng/dL (ref 13–71)

## 2021-11-14 LAB — HUMAN CHORIONIC GONADOTROPIN(HCG),B-SUBUNIT,QUANTITATIVE): HCG, Beta Chain, Quant, S: <1 m[IU]/mL

## 2021-12-05 ENCOUNTER — Ambulatory Visit: Payer: 59 | Attending: Internal Medicine | Admitting: Internal Medicine

## 2021-12-05 ENCOUNTER — Other Ambulatory Visit: Payer: Self-pay

## 2021-12-05 ENCOUNTER — Encounter: Payer: Self-pay | Admitting: Internal Medicine

## 2021-12-05 VITALS — BP 127/84 | HR 70 | Resp 16 | Wt 216.0 lb

## 2021-12-05 DIAGNOSIS — Z6836 Body mass index (BMI) 36.0-36.9, adult: Secondary | ICD-10-CM

## 2021-12-05 DIAGNOSIS — E282 Polycystic ovarian syndrome: Secondary | ICD-10-CM | POA: Diagnosis not present

## 2021-12-05 NOTE — Progress Notes (Signed)
? ? ?Patient ID: Renee Hobbs, female    DOB: 1996/01/27  MRN: [id number] ? ?CC: Weight Check ? ? ?Subjective: ?Renee Hobbs is a 26 y.o. female who presents for f/u. ?Her concerns today include:  ?Obesity, PCOS, preDM, HL ? ?On last visit, patient complained of irregular menses, hirsutism, unexplained weight gain.  Diagnosed with likely PCOS.  Rechecked hormone levels including DHEA sulfate, testosterone level, and these were normal.  Started on metformin and referred to gynecology. ?A few days after starting the metformin, she started having spotting.  This was around 11/18/2021.  Vaginal bleeding continued until it stopped today. ?Initially felt bad on Metformin but now feeling better. ?Feeling "more regulated with sleep and not feeling wired all the time." ?Reports she is still doing well with her eating habits and eating more proteins.  She continues to exercise regularly.  No significant weight changes. ?She has not done the 24-hour urine cortisol as yet to screen for Cushing's.  She plans to do that soon now that the menstrual cycle has ended. ? ?HM:  plans to get PAP when GYN.   ?Current Outpatient Medications on File Prior to Visit  ?Medication Sig Dispense Refill  ? hydrOXYzine (ATARAX) 25 MG tablet Take 25 mg by mouth 2 (two) times daily. (Patient not taking: Reported on 11/13/2021)    ? metFORMIN (GLUCOPHAGE) 500 MG tablet 1 tab Po daily x 1 week then 1 tab PO twice a day 60 tablet 3  ? ?No current facility-administered medications on file prior to visit.  ? ? ?Allergies  ?Allergen Reactions  ? Amoxicillin Shortness Of Breath  ? Doxycycline Shortness Of Breath  ? ? ?Social History  ? ?Socioeconomic History  ? Marital status: Single  ?  Spouse name: Not on file  ? Number of children: 0  ? Years of education: Not on file  ? Highest education level: Bachelor's degree (e.g., BA, AB, BS)  ?Occupational History  ? Not on file  ?Tobacco Use  ? Smoking status: Never  ? Smokeless tobacco: Never  ?Vaping Use  ? Vaping  Use: Never used  ?Substance and Sexual Activity  ? Alcohol use: Not Currently  ? Drug use: Yes  ?  Types: Marijuana  ? Sexual activity: Not on file  ?Other Topics Concern  ? Not on file  ?Social History Narrative  ? Not on file  ? ?Social Determinants of Health  ? ?Financial Resource Strain: Not on file  ?Food Insecurity: Not on file  ?Transportation Needs: Not on file  ?Physical Activity: Not on file  ?Stress: Not on file  ?Social Connections: Not on file  ?Intimate Partner Violence: Not on file  ? ? ?Family History  ?Problem Relation Age of Onset  ? Heart disease Mother   ? Heart failure Mother   ? Diabetes Maternal Uncle   ? ? ?Past Surgical History:  ?Procedure Laterality Date  ? NO PAST SURGERIES    ? ? ?ROS: ?Review of Systems ?Negative except as stated above ? ?PHYSICAL EXAM: ?BP 127/84   Pulse 70   Resp 16   Wt 216 lb (98 kg)   LMP  (LMP Unknown)   SpO2 95%   BMI 36.50 kg/m?   ?Wt Readings from Last 3 Encounters:  ?12/05/21 216 lb (98 kg)  ?11/13/21 218 lb 3.2 oz (99 kg)  ?10/20/21 217 lb (98.4 kg)  ? ? ?Physical Exam ? ?General appearance - alert, well appearing, and in no distress ?Mental status - normal mood, behavior, speech, dress,  motor activity, and thought processes ? ? ? ?  Latest Ref Rng & Units 10/20/2021  ?  3:05 PM 12/25/2018  ? 12:00 PM 12/25/2018  ? 11:09 AM  ?CMP  ?Glucose 70 - 99 mg/dL 83    95    ?BUN 6 - 20 mg/dL 13    14    ?Creatinine 0.57 - 1.00 mg/dL 0.85   0.70   0.75    ?Sodium 134 - 144 mmol/L 140    138    ?Potassium 3.5 - 5.2 mmol/L 4.3    4.2    ?Chloride 96 - 106 mmol/L 101    105    ?CO2 20 - 29 mmol/L 25    22    ?Calcium 8.7 - 10.2 mg/dL 9.6    9.4    ?Total Protein 6.0 - 8.5 g/dL 7.1    7.3    ?Total Bilirubin 0.0 - 1.2 mg/dL <0.2    0.9    ?Alkaline Phos 44 - 121 IU/L 81    61    ?AST 0 - 40 IU/L 19    17    ?ALT 0 - 32 IU/L 22    16    ? ?Lipid Panel  ?   ?Component Value Date/Time  ? CHOL 188 10/20/2021 1505  ? TRIG 241 (H) 10/20/2021 1505  ? HDL 36 (L) 10/20/2021 1505   ? CHOLHDL 5.2 (H) 10/20/2021 1505  ? LDLCALC 110 (H) 10/20/2021 1505  ? ? ?CBC ?   ?Component Value Date/Time  ? WBC 9.0 10/20/2021 1505  ? WBC 7.6 12/25/2018 1109  ? RBC 4.84 10/20/2021 1505  ? RBC 4.77 12/25/2018 1109  ? HGB 13.4 10/20/2021 1505  ? HCT 39.5 10/20/2021 1505  ? PLT 308 10/20/2021 1505  ? MCV 82 10/20/2021 1505  ? MCH 27.7 10/20/2021 1505  ? MCH 28.7 12/25/2018 1109  ? MCHC 33.9 10/20/2021 1505  ? MCHC 33.3 12/25/2018 1109  ? RDW 12.1 10/20/2021 1505  ? LYMPHSABS 2.2 12/25/2018 1109  ? MONOABS 0.7 12/25/2018 1109  ? EOSABS 0.1 12/25/2018 1109  ? BASOSABS 0.1 12/25/2018 1109  ? ? ?ASSESSMENT AND PLAN: ?1. PCOS (polycystic ovarian syndrome) ?Patient will continue metformin.  She will continue healthy eating habits and regular exercise.  She awaits appointment with the gynecologist.  I have given her the phone number to [facility] where the referral was submitted ?- ?2. BMI 36.0-36.9,adult ?See #1 above. ? ?. ? ? ?Patient was given the opportunity to ask questions.  Patient verbalized understanding of the plan and was able to repeat key elements of the plan.  ? ?This documentation was completed using Radio producer.  Any transcriptional errors are unintentional. ? ?No orders of the defined types were placed in this encounter. ? ? ? ?Requested Prescriptions  ? ? No prescriptions requested or ordered in this encounter  ? ? ?No follow-ups on file. ? ?Karle Plumber, MD, FACP ?

## 2022-03-19 ENCOUNTER — Ambulatory Visit: Payer: 59 | Admitting: Internal Medicine

## 2024-04-14 LAB — OB RESULTS CONSOLE HIV ANTIBODY (ROUTINE TESTING): HIV: NONREACTIVE

## 2024-04-14 LAB — OB RESULTS CONSOLE RPR: RPR: NONREACTIVE

## 2024-04-14 LAB — HEPATITIS C ANTIBODY: HCV Ab: NEGATIVE

## 2024-04-14 LAB — OB RESULTS CONSOLE HEPATITIS B SURFACE ANTIGEN: Hepatitis B Surface Ag: NEGATIVE

## 2024-04-14 LAB — OB RESULTS CONSOLE RUBELLA ANTIBODY, IGM: Rubella: IMMUNE

## 2024-04-20 LAB — OB RESULTS CONSOLE GC/CHLAMYDIA
Chlamydia: NEGATIVE
Neisseria Gonorrhea: NEGATIVE

## 2024-07-30 ENCOUNTER — Ambulatory Visit
Admission: EM | Admit: 2024-07-30 | Discharge: 2024-07-30 | Disposition: A | Discharge status: Home / Self Care | Source: Ambulatory Visit | Attending: Nurse Practitioner | Admitting: Nurse Practitioner

## 2024-07-30 ENCOUNTER — Other Ambulatory Visit: Payer: Self-pay

## 2024-07-30 DIAGNOSIS — L089 Local infection of the skin and subcutaneous tissue, unspecified: Secondary | ICD-10-CM | POA: Diagnosis not present

## 2024-07-30 DIAGNOSIS — L729 Follicular cyst of the skin and subcutaneous tissue, unspecified: Secondary | ICD-10-CM | POA: Diagnosis not present

## 2024-07-30 MED ORDER — CLINDAMYCIN HCL 300 MG PO CAPS: 300.0000 mg | ORAL_CAPSULE | Freq: Three times a day (TID) | ORAL | 0 refills | Status: AC

## 2024-07-30 NOTE — ED Triage Notes (Signed)
 Pt has a large lump on posterior of left shoulderx40yrs. Pt states it has gotten biggerx3d. Pt states it has gotten painfulx2wks. Pt states the pain from it and it getting bigger is new. The lump is erythematous. The lump is approx the size of a golf ball

## 2024-07-30 NOTE — ED Provider Notes (Signed)
 [facility]    CSN: [id number] Arrival date & time: 07/30/24  1723      History   Chief Complaint No chief complaint on file.   HPI Renee Hobbs is a 28 y.o. female presents for bump on back.  Patient reports she has had a bump on her left upper back for many years that has been stable.  States doctors have not been worried about it.  Over the past couple weeks its gotten little more painful and over the past 3 days its gotten red and warm.  No drainage fevers or chills.  No history of MRSA.  Patient is [redacted] weeks pregnant.  No other concerns at this time  HPI  Past Medical History:  Diagnosis Date   Concussion    Lyme disease     There are no active problems to display for this patient.   Past Surgical History:  Procedure Laterality Date   NO PAST SURGERIES      OB History     Gravida  1   Para      Term      Preterm      AB      Living         SAB      IAB      Ectopic      Multiple      Live Births               Home Medications    Prior to Admission medications   Medication Sig Start Date End Date Taking? Authorizing Provider  clindamycin  (CLEOCIN ) 300 MG capsule Take 1 capsule (300 mg total) by mouth 3 (three) times daily for 7 days. 07/30/24 08/06/24 Yes Fatiha Guzy, Jodi R, NP  hydrOXYzine (ATARAX) 25 MG tablet Take 25 mg by mouth 2 (two) times daily. Patient not taking: Reported on 11/13/2021 10/09/21   [provider]  metFORMIN  (GLUCOPHAGE ) 500 MG tablet 1 tab Po daily x 1 week then 1 tab PO twice a day 11/13/21   Vicci Barnie NOVAK, MD    Family History Family History  Problem Relation Age of Onset   Heart disease Mother    Heart failure Mother    Diabetes Maternal Uncle     Social History Social History   Tobacco Use   Smoking status: Never   Smokeless tobacco: Never  Vaping Use   Vaping status: Never Used  Substance Use Topics   Alcohol use: Not Currently   Drug use: Not Currently    Types: Marijuana      Allergies   Amoxicillin and Doxycycline   Review of Systems Review of Systems  Skin:        Infected cyst of back     Physical Exam Triage Vital Signs ED Triage Vitals  Encounter Vitals Group     BP 07/30/24 1738 115/79     Girls Systolic BP Percentile --      Girls Diastolic BP Percentile --      Boys Systolic BP Percentile --      Boys Diastolic BP Percentile --      Pulse Rate 07/30/24 1738 94     Resp 07/30/24 1738 20     Temp 07/30/24 1738 98 F (36.7 C)     Temp Source 07/30/24 1738 Oral     SpO2 07/30/24 1738 97 %     Weight --      Height --      Head Circumference --  Peak Flow --      Pain Score 07/30/24 1736 7     Pain Loc --      Pain Education --      Exclude from Growth Chart --    No data found.  Updated Vital Signs BP 115/79   Pulse 94   Temp 98 F (36.7 C) (Oral)   Resp 20   LMP 02/02/2024   SpO2 97%   Visual Acuity Right Eye Distance:   Left Eye Distance:   Bilateral Distance:    Right Eye Near:   Left Eye Near:    Bilateral Near:     Physical Exam Vitals and nursing note reviewed.  Constitutional:      General: She is not in acute distress.    Appearance: Normal appearance. She is not ill-appearing.  HENT:     Head: Normocephalic and atraumatic.  Eyes:     Pupils: Pupils are equal, round, and reactive to light.  Cardiovascular:     Rate and Rhythm: Normal rate.  Pulmonary:     Effort: Pulmonary effort is normal.  Skin:    General: Skin is warm and dry.         Comments: There is an infected cyst to the left upper back about the size of a golf ball.  Mild erythema without fluctuance or induration.  It is tender to palpation.  Neurological:     General: No focal deficit present.     Mental Status: She is alert and oriented to person, place, and time.  Psychiatric:        Mood and Affect: Mood normal.        Behavior: Behavior normal.      UC Treatments / Results  Labs (all labs ordered are listed, but  only abnormal results are displayed) Labs Reviewed - No data to display  EKG   Radiology No results found.  Procedures Procedures (including critical care time)  Medications Ordered in UC Medications - No data to display  Initial Impression / Assessment and Plan / UC Course  I have reviewed the triage vital signs and the nursing notes.  Pertinent labs & imaging results that were available during my care of the patient were reviewed by me and considered in my medical decision making (see chart for details).     Reviewed exam and symptoms with patient.  Infected cyst of back.  No fevers and patient is well-appearing.  Patient is [redacted] weeks pregnant.  She does have an allergy to amoxicillin with shortness of breath and has never been on a cephalosporin.  Discussed case with Dr. Kriste, will do clindamycin , advised to eat yogurt to help prevent diarrhea.  Instructed to monitor symptoms very closely if she does not have any improvement in symptoms and/or they worsen she needs to seek reevaluation ASAP and also follow-up with her PCP in 2 to 3 days for recheck. Final Clinical Impressions(s) / UC Diagnoses   Final diagnoses:  Infected cyst of skin     Discharge Instructions      Start clindamycin  antibiotic as prescribed.  Monitor symptoms closely and if you do not have any improvement in symptoms and/or they are worsening please seek reevaluation.  Follow-up with your PCP in 2 to 3 days for recheck.  Hope you feel better soon!     ED Prescriptions     Medication Sig Dispense Auth. Provider   clindamycin  (CLEOCIN ) 300 MG capsule Take 1 capsule (300 mg total) by mouth 3 (  three) times daily for 7 days. 21 capsule Briana Farner, Jodi R, NP      PDMP not reviewed this encounter.   Loreda Myla SAUNDERS, NP 07/30/24 1820

## 2024-07-30 NOTE — Discharge Instructions (Addendum)
 Start clindamycin  antibiotic as prescribed.  Monitor symptoms closely and if you do not have any improvement in symptoms and/or they are worsening please seek reevaluation.  Follow-up with your PCP in 2 to 3 days for recheck.  Hope you feel better soon!

## 2024-09-01 NOTE — Progress Notes (Signed)
" ° °  PROVIDER:  LEONOR MACARIO DAWN, MD  MRN: [id number] DOB: [birth year] DATE OF ENCOUNTER: 09/01/2024  HPI: Patient presents for her scheduled procedure today. Yesterday she started having spontaneous drainage of thick, foul-smelling fluid from the cyst on her back. On exam today there is purulent drainage from a small defect over the cyst, consistent with an infected sebaceous cyst.   Procedure Name: Incision and drainage of left upper back abscess (secondary to an infected sebaceous cyst)  Procedure details: The skin overlying the cyst was prepped with chlorhexidine and infiltrated with 6mL 1% lidocaine with epinephrine. An 11-blade scalpel was used to extend the skin defect superiorly. Copious purulent drainage was expressed, as well as more thick solid material consistent with a sebaceous cyst. The wound was swabbed for cultures and irrigated with saline. The wound was packed with iodoform gauze and a bulky gauze dressing was applied.  The patient tolerated the procedure well with no apparent complications. She was prescribed a short course of antibiotics given associated cellulitis and pregnancy. She will follow up next week for a wound check, and again in a few months after her delivery to discuss definitive excision of the cyst.  She was provided with both written and verbal wound care instructions and was encouraged to call with any questions or concerns before her next appointment. "

## 2024-09-08 NOTE — Progress Notes (Signed)
" ° °  PROVIDER:  PUJA GOSAI MACZIS, PA  MRN: [id number] DOB: Aug 22, 1996 DATE OF ENCOUNTER: 09/08/2024 Interval History:   Renee Hobbs is a 28 y.o. female in her third trimester, who underwent bedside I&D of infected left upper back cyst on 09/01/2024 by Dr. Dasie.  Copious purulent drainage and sebaceous cyst material was evacuated.  The cavity was packed with iodoform gauze which she removed the following day.  She has kept the area covered with gauze and tape since then.  However, the tape is irritating her skin and feels like a burn.  She denies much pain other than skin irritation.  Denies fever.  Review of Systems:   ROS All other systems reviewed and are negative.  Medications:   Current Outpatient Medications on File Prior to Visit  Medication Sig Dispense Refill   prenatal vitamin with iron-folic acid (PRENATAL TABLETS) tablet Take 1 tablet by mouth once daily     No current facility-administered medications on file prior to visit.    Physical Examination:   There were no vitals taken for this visit.  General: Well-developed, well-nourished, in no acute distress.   Left upper back: Incision is about 2 cm x 1 cm with clean base.  No surrounding erythema or drainage.  There is redness from the tape outline    Assessment and Plan:   Diagnoses and all orders for this visit:  Infected sebaceous cyst of skin  Status post incision and drainage    Renee Hobbs is status post bedside I&D of large infected sebaceous cyst on left upper back on 09/01/2024 by Dr. Dasie.  On exam today, the wound is healing very well without signs of infection.  I recommended continuing to keep the area covered until it no longer drains.  She has an appointment to see Dr. Dasie again at the end of March to discuss excising the remaining cyst material.  She will call if she would like the area reevaluated before then if she has any questions or concerns.  Return for appt with surgeon as planned.  Puja  Maczis, [facility] "

## 2024-10-07 LAB — OB RESULTS CONSOLE GBS: GBS: NEGATIVE

## 2024-10-07 NOTE — Progress Notes (Signed)
" °  °  Patient referred to pharmacy. Vaccine not in stock at clinic.  "

## 2024-10-14 ENCOUNTER — Encounter (HOSPITAL_COMMUNITY): Payer: Self-pay | Admitting: Obstetrics and Gynecology

## 2024-10-14 ENCOUNTER — Inpatient Hospital Stay (HOSPITAL_COMMUNITY)
Admission: AD | Admit: 2024-10-14 | Discharge: 2024-10-14 | Disposition: A | Discharge status: Home / Self Care | Attending: Obstetrics and Gynecology | Admitting: Obstetrics and Gynecology

## 2024-10-14 DIAGNOSIS — O1403 Mild to moderate pre-eclampsia, third trimester: Secondary | ICD-10-CM

## 2024-10-14 LAB — CBC
HCT: 32 % — ABNORMAL LOW (ref 36.0–46.0)
Hemoglobin: 10.8 g/dL — ABNORMAL LOW (ref 12.0–15.0)
MCH: 27.4 pg (ref 26.0–34.0)
MCHC: 33.8 g/dL (ref 30.0–36.0)
MCV: 81.2 fL (ref 80.0–100.0)
Platelets: 239 10*3/uL (ref 150–400)
RBC: 3.94 MIL/uL (ref 3.87–5.11)
RDW: 12.9 % (ref 11.5–15.5)
WBC: 10.9 10*3/uL — ABNORMAL HIGH (ref 4.0–10.5)
nRBC: 0 % (ref 0.0–0.2)

## 2024-10-14 LAB — COMPREHENSIVE METABOLIC PANEL WITH GFR
ALT: 13 U/L (ref 0–44)
AST: 16 U/L (ref 15–41)
Albumin: 3.4 g/dL — ABNORMAL LOW (ref 3.5–5.0)
Alkaline Phosphatase: 143 U/L — ABNORMAL HIGH (ref 38–126)
Anion gap: 11 (ref 5–15)
BUN: 14 mg/dL (ref 6–20)
CO2: 21 mmol/L — ABNORMAL LOW (ref 22–32)
Calcium: 9.1 mg/dL (ref 8.9–10.3)
Chloride: 104 mmol/L (ref 98–111)
Creatinine, Ser: 0.61 mg/dL (ref 0.44–1.00)
GFR, Estimated: >60 mL/min
Glucose, Bld: 129 mg/dL — ABNORMAL HIGH (ref 70–99)
Potassium: 4.2 mmol/L (ref 3.5–5.1)
Sodium: 136 mmol/L (ref 135–145)
Total Bilirubin: <0.2 mg/dL (ref 0.0–1.2)
Total Protein: 6 g/dL — ABNORMAL LOW (ref 6.5–8.1)

## 2024-10-14 LAB — PROTEIN / CREATININE RATIO, URINE
Creatinine, Urine: 316 mg/dL
Protein Creatinine Ratio: 0.3 mg/mg — ABNORMAL HIGH
Total Protein, Urine: 83 mg/dL

## 2024-10-14 MED ORDER — FUROSEMIDE 40 MG PO TABS: 40.0000 mg | ORAL_TABLET | Freq: Every day | ORAL | 0 refills | Status: AC

## 2024-10-14 MED ORDER — POTASSIUM CHLORIDE CRYS ER 20 MEQ PO TBCR: 20.0000 meq | EXTENDED_RELEASE_TABLET | Freq: Every day | ORAL | 0 refills | Status: AC

## 2024-10-14 NOTE — MAU Note (Signed)
 Sent from office for hypertension.

## 2024-10-14 NOTE — MAU Provider Note (Incomplete)
 History     Chief Complaint  Patient presents with   Hypertension     {GYN/OB [id number]}  Past Medical History:  Diagnosis Date   Concussion    Lyme disease     Past Surgical History:  Procedure Laterality Date   NO PAST SURGERIES      Family History  Problem Relation Age of Onset   Heart disease Mother    Heart failure Mother    Diabetes Maternal Uncle     Social History[1]  Allergies: Allergies[2]  Medications Prior to Admission  Medication Sig Dispense Refill Last Dose/Taking   hydrOXYzine (ATARAX) 25 MG tablet Take 25 mg by mouth 2 (two) times daily. (Patient not taking: Reported on 11/13/2021)      metFORMIN  (GLUCOPHAGE ) 500 MG tablet 1 tab Po daily x 1 week then 1 tab PO twice a day 60 tablet 3      Physical Exam   Blood pressure 129/67, pulse (!) 101, last menstrual period 02/02/2024, SpO2 99%.  {exam, Complete:18323} ED Course   MDM ***  Renee DELENA Carder, MD 6:18 PM 10/14/2024        [1]  Social History Tobacco Use   Smoking status: Never   Smokeless tobacco: Never  Vaping Use   Vaping status: Never Used  Substance Use Topics   Alcohol use: Not Currently   Drug use: Not Currently    Types: Marijuana  [2]  Allergies Allergen Reactions   Amoxicillin Shortness Of Breath   Doxycycline Shortness Of Breath

## 2024-10-16 ENCOUNTER — Telehealth (HOSPITAL_COMMUNITY): Payer: Self-pay | Admitting: *Deleted

## 2024-10-16 ENCOUNTER — Encounter (HOSPITAL_COMMUNITY): Payer: Self-pay | Admitting: *Deleted

## 2024-10-16 NOTE — Telephone Encounter (Signed)
 Preadmission screen

## 2024-10-17 ENCOUNTER — Inpatient Hospital Stay (HOSPITAL_COMMUNITY): Admission: AD | Admit: 2024-10-17 | Source: Home / Self Care | Admitting: Obstetrics and Gynecology

## 2024-10-17 ENCOUNTER — Inpatient Hospital Stay (HOSPITAL_COMMUNITY)

## 2024-10-19 ENCOUNTER — Encounter (HOSPITAL_COMMUNITY)
